# Patient Record
Sex: Female | Born: 1980 | Race: White | Hispanic: No | Marital: Single | Smoking: Current every day smoker
Health system: Southern US, Community
[De-identification: ages and names within clinical notes are randomized; demographics above are authoritative.]

## PROBLEM LIST (undated history)

## (undated) DIAGNOSIS — F32A Depression, unspecified: Secondary | ICD-10-CM

## (undated) DIAGNOSIS — G2581 Restless legs syndrome: Secondary | ICD-10-CM

## (undated) DIAGNOSIS — M2550 Pain in unspecified joint: Secondary | ICD-10-CM

## (undated) HISTORY — DX: Restless legs syndrome: G25.81

## (undated) HISTORY — DX: Depression, unspecified: F32.A

## (undated) HISTORY — PX: MOUTH SURGERY: SHX715

## (undated) HISTORY — DX: Pain in unspecified joint: M25.50

---

## 2004-01-11 ENCOUNTER — Emergency Department: Payer: Self-pay | Admitting: Emergency Medicine

## 2004-02-23 ENCOUNTER — Emergency Department: Payer: Self-pay | Admitting: Emergency Medicine

## 2005-07-22 ENCOUNTER — Emergency Department: Payer: Self-pay | Admitting: Emergency Medicine

## 2006-08-13 ENCOUNTER — Emergency Department: Payer: Self-pay | Admitting: Internal Medicine

## 2012-07-23 ENCOUNTER — Emergency Department: Payer: Self-pay | Admitting: Emergency Medicine

## 2012-07-23 LAB — BASIC METABOLIC PANEL
BUN: 6 mg/dL — ABNORMAL LOW (ref 7–18)
Calcium, Total: 8.7 mg/dL (ref 8.5–10.1)
EGFR (Non-African Amer.): >60
Glucose: 90 mg/dL (ref 65–99)
Potassium: 3.6 mmol/L (ref 3.5–5.1)

## 2012-07-23 LAB — CK TOTAL AND CKMB (NOT AT ARMC): CK-MB: <0.5 ng/mL — ABNORMAL LOW (ref 0.5–3.6)

## 2012-07-23 LAB — PROTIME-INR: Prothrombin Time: 11.8 secs (ref 11.5–14.7)

## 2012-07-23 LAB — CBC
HCT: 42.1 % (ref 35.0–47.0)
HGB: 14.9 g/dL (ref 12.0–16.0)
MCHC: 35.4 g/dL (ref 32.0–36.0)
Platelet: 245 10*3/uL (ref 150–440)
RBC: 4.59 10*6/uL (ref 3.80–5.20)
RDW: 14.2 % (ref 11.5–14.5)
WBC: 11 10*3/uL (ref 3.6–11.0)

## 2012-07-30 ENCOUNTER — Encounter: Payer: Self-pay | Admitting: Cardiovascular Disease

## 2012-07-30 ENCOUNTER — Ambulatory Visit (INDEPENDENT_AMBULATORY_CARE_PROVIDER_SITE_OTHER): Payer: Self-pay | Admitting: Cardiovascular Disease

## 2012-07-30 VITALS — BP 112/74 | HR 92 | Ht 61.0 in | Wt 168.5 lb

## 2012-07-30 DIAGNOSIS — F17200 Nicotine dependence, unspecified, uncomplicated: Secondary | ICD-10-CM | POA: Insufficient documentation

## 2012-07-30 DIAGNOSIS — R079 Chest pain, unspecified: Secondary | ICD-10-CM

## 2012-07-30 DIAGNOSIS — R0602 Shortness of breath: Secondary | ICD-10-CM | POA: Insufficient documentation

## 2012-07-30 NOTE — Patient Instructions (Addendum)
You are doing well. No medication changes were made.  Please call us if you have new issues that need to be addressed before your next appt.    

## 2012-07-30 NOTE — Assessment & Plan Note (Signed)
Lungs are clear on today's visit. We have encouraged her to stop smoking. As above, less likely PE. No tachycardia, presentation is atypical. D-dimer negative.

## 2012-07-30 NOTE — Assessment & Plan Note (Signed)
We have encouraged her to continue to work on weaning her cigarettes and smoking cessation. She will continue to work on this and does not want any assistance with chantix.  

## 2012-07-30 NOTE — Progress Notes (Signed)
Patient ID: Ellen Miller, female    DOB: [birth year], 32 y.o.   MRN: [id number]  HPI Comments: Ellen Miller is a 32 year old woman with long history of smoking since age 9 for total of 17 years who recently presented to the emergency room with left flank pain. D-dimer was negative, cardiac enzymes negative, other workup negative including normal EKG. She was discharged home with followup today.  She denies any prior cardiac history. She reports having swelling of her right ankle several weeks ago. She went on vacation over one week ago to [place]. She had walking activities a fair. She felt shortness of breath. After coming back home, she developed left-sided flank pain. With deep breath she has left-sided pain. Again workup in the emergency room was essentially normal. She feels that her breathing and pain on the left side of her chest her about the same. She's not taking deep breaths as it hurts to breathe. If she lies on her left side she has discomfort on the left flank underneath her left arm in the arm pit region. She has point tenderness in that area to palpation. She denies any heavy lifting or trauma to the area.  She has symptoms at rest and sometimes with exertion. In the emergency room she was given ibuprofen to take. EKG in the emergency room showed normal sinus rhythm with no significant ST changes, she had the same T wave abnormality in V1 through V3  EKG shows normal sinus rhythm with rate 92 beats per minute, nonspecific T wave abnormality in V1 through V3   Outpatient Encounter Prescriptions as of 07/30/2012  Medication Sig Dispense Refill  . acetaminophen (TYLENOL) 325 MG tablet Take 650 mg by mouth every 6 (six) hours as needed for pain.      Marland Kitchen gabapentin (NEURONTIN) 600 MG tablet Take 600 mg by mouth daily.      Marland Kitchen ibuprofen (ADVIL,MOTRIN) 800 MG tablet Take 800 mg by mouth as needed for pain.      . traMADol (ULTRAM) 50 MG tablet Take 50 mg by mouth every 4 (four) hours as needed  for pain.        Review of Systems  Constitutional: Negative.   HENT: Negative.   Eyes: Negative.   Respiratory: Positive for shortness of breath.   Cardiovascular: Negative.        Left flank pain underneath her left arm in the arm pit region, tender to palpation and when lying on it  Gastrointestinal: Negative.   Musculoskeletal: Negative.   Skin: Negative.   Neurological: Negative.   Psychiatric/Behavioral: Negative.   All other systems reviewed and are negative.    BP 112/74  Pulse 92  Ht 5\' 1"  (1.549 m)  Wt 168 lb 8 oz (76.431 kg)  BMI 31.85 kg/m2   Physical Exam  Nursing note and vitals reviewed. Constitutional: She is oriented to person, place, and time. She appears well-developed and well-nourished.  HENT:  Head: Normocephalic.  Nose: Nose normal.  Mouth/Throat: Oropharynx is clear and moist.  Eyes: Conjunctivae are normal. Pupils are equal, round, and reactive to light.  Neck: Normal range of motion. Neck supple. No JVD present.  Cardiovascular: Normal rate, regular rhythm, S1 normal, S2 normal, normal heart sounds and intact distal pulses.  Exam reveals no gallop and no friction rub.   No murmur heard. Pulmonary/Chest: Effort normal and breath sounds normal. No respiratory distress. She has no wheezes. She has no rales. She exhibits no tenderness.  Abdominal: Soft. Bowel sounds are  normal. She exhibits no distension. There is no tenderness.  Musculoskeletal: Normal range of motion. She exhibits no edema and no tenderness.  Lymphadenopathy:    She has no cervical adenopathy.  Neurological: She is alert and oriented to person, place, and time. Coordination normal.  Skin: Skin is warm and dry. No rash noted. No erythema.  Psychiatric: She has a normal mood and affect. Her behavior is normal. Judgment and thought content normal.    Assessment and Plan

## 2012-07-30 NOTE — Assessment & Plan Note (Addendum)
Left flank pain underneath her armpit on the left is atypical in nature, tender to palpation and when she lies on her left side. Does not appear to be cardiac in presentation. We did discuss doing a chest CT scan with contrast. We will hold off for now as presentation is atypical. D-dimer was negative making PE less likely. Unable to exclude pleuritic etiology as she has some discomfort with deep breathing. I suggested symptoms get worse that she contact our office for further evaluation. We will order a CT scan. She does not have insurance.  Less likely coronary artery disease and ischemia as symptoms occur with rest and are positional in nature.

## 2013-09-01 ENCOUNTER — Emergency Department: Payer: Self-pay | Admitting: Internal Medicine

## 2014-08-20 ENCOUNTER — Ambulatory Visit: Admission: EM | Admit: 2014-08-20 | Discharge: 2014-08-20 | Discharge status: Absent without leave | Payer: Self-pay

## 2014-08-20 ENCOUNTER — Ambulatory Visit
Admission: EM | Admit: 2014-08-20 | Discharge: 2014-08-20 | Disposition: A | Discharge status: Home / Self Care | Payer: Self-pay | Attending: Emergency Medicine | Admitting: Emergency Medicine

## 2014-08-20 ENCOUNTER — Ambulatory Visit: Payer: Self-pay | Attending: Emergency Medicine

## 2014-08-20 DIAGNOSIS — M7989 Other specified soft tissue disorders: Secondary | ICD-10-CM

## 2014-08-20 MED ORDER — DICLOFENAC SODIUM 75 MG PO TBEC: 75.0000 mg | DELAYED_RELEASE_TABLET | Freq: Two times a day (BID) | ORAL | Status: DC

## 2014-08-20 NOTE — ED Provider Notes (Addendum)
HPI  SUBJECTIVE:  Ellen Miller is a 34 y.o. female who presents with left calf pain, swelling down to her foot for the past several days. States that her right foot started swelling yesterday. She reports pain that is sharp, throbbing, worse with walking, slightly better with elevation. Pain is unaffected with dorsiflexion, plantarflexion, but only when putting weight on it. She tried ibuprofen 800 mg and Tylenol twice yesterday which she states is not helping. She also tried warm soaks. She reports slightly decreased sensation in her left foot, states it "feels asleep". She denies any numbness or tingling. There is no erythema, rash, trauma, color changes. She denies being out in the heat for prolonged periods of time.  She also reports polyarthralgias for the past several weeks in her elbows, wrists, ankles, knees, hips. She denies any tick bite, joint redness. Is concerned that this is RA. No nausea, vomiting or fevers. No orthopnea, nocturia, PND, unintentional weight gain. No exogenous estrogen, prolonged immobilization, surgery in the past 4 weeks, history of DVT, history of PE. No history of CHF, hypertension, diabetes,  cancer.     Past Medical History  Diagnosis Date  . RLS (restless legs syndrome)     Past Surgical History  Procedure Laterality Date  . Mouth surgery      Family History  Problem Relation Age of Onset  . Hypertension Mother   . Heart disease Father     stents    History  Substance Use Topics  . Smoking status: Current Every Day Smoker -- 1.50 packs/day    Types: Cigarettes  . Smokeless tobacco: Not on file  . Alcohol Use: Yes     Comment: occasional    No current facility-administered medications for this encounter.  Current outpatient prescriptions:  .  acetaminophen (TYLENOL) 325 MG tablet, Take 650 mg by mouth every 6 (six) hours as needed for pain., Disp: , Rfl:  .  diclofenac (VOLTAREN) 75 MG EC tablet, Take 1 tablet (75 mg total) by mouth 2  (two) times daily. Take with food, Disp: 30 tablet, Rfl: 0 .  gabapentin (NEURONTIN) 600 MG tablet, Take 600 mg by mouth daily., Disp: , Rfl:  .  traMADol (ULTRAM) 50 MG tablet, Take 50 mg by mouth every 4 (four) hours as needed for pain., Disp: , Rfl:   No Known Allergies   ROS  As noted in HPI.   Physical Exam  BP 119/84 mmHg  Pulse 88  Temp(Src) 98.2 F (36.8 C) (Tympanic)  Ht  (1.549 m)  Wt 168 lb (76.204 kg)  BMI 31.76 kg/m2  SpO2 100%  LMP   Constitutional: Well developed, well nourished, no acute distress Eyes:  EOMI, conjunctiva normal bilaterally HENT: Normocephalic, atraumatic,mucus membranes moist Respiratory: Normal inspiratory effort Cardiovascular: Normal rate GI: nondistended skin: No rash, skin intact Musculoskeletal:1+ pitting edema bilaterally, DP 2+ bilaterally. Sensation grossly intact. Tenderness along posterior aspect left calf, no tenderness along the right. Right calf measures 37 cm, left calf measures 37.5 cm. No joint erythema, signs of trauma, bruising along her distal lower extremities.  Neurologic: Alert & oriented x 3, no focal neuro deficits Psychiatric: Speech and behavior appropriate   ED Course   Medications - No data to display  Orders Placed This Encounter  Procedures  . US Venous Img Lower Unilateral Left    Standing Status: Future     Number of Occurrences:      Standing Expiration Date: 10/21/2015    Order Specific Question:  Reason for Exam (SYMPTOM  OR DIAGNOSIS REQUIRED)    Answer:  LE edema, calf pain R/o DVT    Order Specific Question:  Preferred imaging location?    Answer:  [facility]    Order Specific Question:  Call Report- Best Contact Number?    Answer:  [phone] [facility] call with results    No results found for this or any previous visit (from the past 24 hour(s)). No results found.  ED Clinical Impression  Leg swelling  ED Assessment/Plan  Vitals are normal. Discussed with patient that  we are unable to do a workup for rheumatoid arthritis here, advised follow-up with her primary care physician for this. Pt's primary concern today seems to be the left leg swelling. No evidence of septic joint. Concern for DVT given physical exam.   Ordered OP u/s. She has an appointment today at [facility] at 4:30. If negative, patient will go home with elevation, decrease salt, compression hose, diclofenac for the joint aches, if positive she'll go to the ER for acute DVT. Patient agrees with plan.   *This clinic note was created using Dragon dictation software. Therefore, there may be occasional mistakes despite careful proofreading.  ?    Domenick Gong, MD 08/20/14 2017  10/10 1023- pt did not show for venous u/s.   Domenick Gong, MD 11/01/15 1023

## 2014-08-20 NOTE — ED Notes (Signed)
Pt states "I have joint swelling and pain for 3 days."

## 2014-08-20 NOTE — Discharge Instructions (Signed)
Elevation, compression stockings. Diclofenac for the joint aches. You may take 1 g of Tylenol with the diclofenac. You can take up to 4 g of Tylenol per day. Follow-up with your doctor about the joint aches and possible rheumatoid arthritis workup. Stay at the imaging center until the report is called in to me. If your ultrasound is negative for blood clot in your leg, then plan as above. If your ultrasound is positive for blood clot, you will need to go to the emergency room.  Your appointment is at 4:30 at the at [facility] in the medical mall, please arrive there at 4 pm.

## 2014-08-20 NOTE — ED Notes (Signed)
"  My mon and aunt have RA. I have a PCP, [facility] but I have been scared to go find out about RA."

## 2015-02-04 ENCOUNTER — Ambulatory Visit
Admission: EM | Admit: 2015-02-04 | Discharge: 2015-02-04 | Disposition: A | Discharge status: Home / Self Care | Payer: 59 | Attending: Family Medicine | Admitting: Family Medicine

## 2015-02-04 DIAGNOSIS — J069 Acute upper respiratory infection, unspecified: Secondary | ICD-10-CM | POA: Diagnosis not present

## 2015-02-04 MED ORDER — OSELTAMIVIR PHOSPHATE 75 MG PO CAPS: 75.0000 mg | ORAL_CAPSULE | Freq: Two times a day (BID) | ORAL | Status: DC

## 2015-02-04 NOTE — ED Notes (Signed)
Started yesterday morning with nasal drainage and congestion. + cough.

## 2015-02-04 NOTE — Discharge Instructions (Signed)
Take medication as prescribed. Rest. Drink clear fluids. Take over-the-counter Tylenol or ibuprofen as needed.  Follow-up with primary care physician this week as needed. Return to urgent care proceed to ER for new or worsening concerns.  Upper Respiratory Infection, Adult Most upper respiratory infections (URIs) are a viral infection of the air passages leading to the lungs. A URI affects the nose, throat, and upper air passages. The most common type of URI is nasopharyngitis and is typically referred to as "the common cold." URIs run their course and usually go away on their own. Most of the time, a URI does not require medical attention, but sometimes a bacterial infection in the upper airways can follow a viral infection. This is called a secondary infection. Sinus and middle ear infections are common types of secondary upper respiratory infections. Bacterial pneumonia can also complicate a URI. A URI can worsen asthma and chronic obstructive pulmonary disease (COPD). Sometimes, these complications can require emergency medical care and may be life threatening.  CAUSES Almost all URIs are caused by viruses. A virus is a type of germ and can spread from one person to another.  RISKS FACTORS You may be at risk for a URI if:   You smoke.   You have chronic heart or lung disease.  You have a weakened defense (immune) system.   You are very young or very old.   You have nasal allergies or asthma.  You work in crowded or poorly ventilated areas.  You work in health care facilities or schools. SIGNS AND SYMPTOMS  Symptoms typically develop 2-3 days after you come in contact with a cold virus. Most viral URIs last 7-10 days. However, viral URIs from the influenza virus (flu virus) can last 14-18 days and are typically more severe. Symptoms may include:   Runny or stuffy (congested) nose.   Sneezing.   Cough.   Sore throat.   Headache.   Fatigue.   Fever.   Loss of  appetite.   Pain in your forehead, behind your eyes, and over your cheekbones (sinus pain).  Muscle aches.  DIAGNOSIS  Your health care provider may diagnose a URI by:  Physical exam.  Tests to check that your symptoms are not due to another condition such as:  Strep throat.  Sinusitis.  Pneumonia.  Asthma. TREATMENT  A URI goes away on its own with time. It cannot be cured with medicines, but medicines may be prescribed or recommended to relieve symptoms. Medicines may help:  Reduce your fever.  Reduce your cough.  Relieve nasal congestion. HOME CARE INSTRUCTIONS   Take medicines only as directed by your health care provider.   Gargle warm saltwater or take cough drops to comfort your throat as directed by your health care provider.  Use a warm mist humidifier or inhale steam from a shower to increase air moisture. This may make it easier to breathe.  Drink enough fluid to keep your urine clear or pale yellow.   Eat soups and other clear broths and maintain good nutrition.   Rest as needed.   Return to work when your temperature has returned to normal or as your health care provider advises. You may need to stay home longer to avoid infecting others. You can also use a face mask and careful hand washing to prevent spread of the virus.  Increase the usage of your inhaler if you have asthma.   Do not use any tobacco products, including cigarettes, chewing tobacco, or electronic cigarettes.  If you need help quitting, ask your health care provider. PREVENTION  The best way to protect yourself from getting a cold is to practice good hygiene.   Avoid oral or hand contact with people with cold symptoms.   Wash your hands often if contact occurs.  There is no clear evidence that vitamin C, vitamin E, echinacea, or exercise reduces the chance of developing a cold. However, it is always recommended to get plenty of rest, exercise, and practice good nutrition.    SEEK MEDICAL CARE IF:   You are getting worse rather than better.   Your symptoms are not controlled by medicine.   You have chills.  You have worsening shortness of breath.  You have brown or red mucus.  You have yellow or brown nasal discharge.  You have pain in your face, especially when you bend forward.  You have a fever.  You have swollen neck glands.  You have pain while swallowing.  You have white areas in the back of your throat. SEEK IMMEDIATE MEDICAL CARE IF:   You have severe or persistent:  Headache.  Ear pain.  Sinus pain.  Chest pain.  You have chronic lung disease and any of the following:  Wheezing.  Prolonged cough.  Coughing up blood.  A change in your usual mucus.  You have a stiff neck.  You have changes in your:  Vision.  Hearing.  Thinking.  Mood. MAKE SURE YOU:   Understand these instructions.  Will watch your condition.  Will get help right away if you are not doing well or get worse.   This information is not intended to replace advice given to you by your health care provider. Make sure you discuss any questions you have with your health care provider.   Document Released: 07/04/2000 Document Revised: 05/25/2014 Document Reviewed: 04/15/2013 Elsevier Interactive Patient Education [employer].

## 2015-02-04 NOTE — ED Provider Notes (Signed)
[facility]  ____________________________________________  Time seen: Approximately 6:12 PM  I have reviewed the triage vital signs and the nursing notes.   HISTORY  Chief Complaint URI   HPI Ellen Miller is a 35 y.o. female presents with spouse at bedside for the complaints of 1 day history of runny nose, nasal congestion, body aches, cough, sinus congestion with intermittent fever. Reports she believes her fever was 99 degrees orally. Reports symptoms have been unresolved with over-the-counter cough and congestion medication. Reports general body aches at this time at 4 out of 10. 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 Patient also reports that this past Sunday she was around a friend has since tested positive for influenza.  Denies chest pain, shortness of breath, abdominal pain, vomiting, diarrhea, dizziness, rash, neck or back pain. Denies dysuria. Denies vaginal complaints.  Last menstrual. One week ago. Denies chance of pregnancy.    Past Medical History  Diagnosis Date  . RLS (restless legs syndrome)     Patient Active Problem List   Diagnosis Date Noted  . Chest pain 07/30/2012  . Shortness of breath 07/30/2012  . Smoker 07/30/2012    Past Surgical History  Procedure Laterality Date  . Mouth surgery      Current Outpatient Rx  Name  Route  Sig  Dispense  Refill  . acetaminophen (TYLENOL) 325 MG tablet   Oral   Take 650 mg by mouth every 6 (six) hours as needed for pain.         Marland Kitchen diclofenac (VOLTAREN) 75 MG EC tablet   Oral   Take 1 tablet (75 mg total) by mouth 2 (two) times daily. Take with food   30 tablet   0   . gabapentin (NEURONTIN) 600 MG tablet   Oral   Take 600 mg by mouth daily.         . traMADol (ULTRAM) 50 MG tablet   Oral   Take 50 mg by mouth every 4 (four) hours as needed for pain.           Allergies Review of patient's allergies indicates no known  allergies.  Family History  Problem Relation Age of Onset  . Hypertension Mother   . Heart disease Father     stents    Social History Social History  Substance Use Topics  . Smoking status: Current Every Day Smoker -- 1.00 packs/day    Types: Cigarettes  . Smokeless tobacco: None  . Alcohol Use: Yes     Comment: occasional    Review of Systems Constitutional: Positive report of fevers. Eyes: No visual changes. ENT: No sore throat. Positive runny nose, nasal congestion, sinus congestion and pressure. Positive intermittent cough. Cardiovascular: Denies chest pain. Respiratory: Denies shortness of breath. Gastrointestinal: No abdominal pain.  No nausea, no vomiting.  No diarrhea.  No constipation. Genitourinary: Negative for dysuria. Musculoskeletal: Negative for back pain. Skin: Negative for rash. Neurological: Negative for headaches, focal weakness or numbness.  10-point ROS otherwise negative.  ____________________________________________   PHYSICAL EXAM:  VITAL SIGNS: ED Triage Vitals  Enc Vitals Group     BP 02/04/15 1700 128/83 mmHg     Pulse Rate 02/04/15 1700 93     Resp 02/04/15 1700 16     Temp 02/04/15 1700 97.9 F (36.6 C)     Temp Source 02/04/15 1700 Tympanic     SpO2 02/04/15 1700 99 %     Weight 02/04/15  1700 165 lb (74.844 kg)     Height 02/04/15 1700 5\' 1"  (1.549 m)     Head Cir --      Peak Flow --      Pain Score 02/04/15 1704 7     Pain Loc --      Pain Edu? --      Excl. in GC? --     Constitutional: Alert and oriented. Well appearing and in no acute distress. Eyes: Conjunctivae are normal. PERRL. EOMI. Head: Atraumatic. Mild tenderness to palpation on frontal and maxillary sinuses. No swelling. No erythema.  Ears: no erythema, normal TMs bilaterally.   Nose: Positive nasal congestion and clear rhinorrhea.  Mouth/Throat: Mucous membranes are moist.  Oropharynx non-erythematous. No tonsillar swelling or exudate. Neck: No stridor.  No  cervical spine tenderness to palpation. Hematological/Lymphatic/Immunilogical: No cervical lymphadenopathy. Cardiovascular: Normal rate, regular rhythm. Grossly normal heart sounds.  Good peripheral circulation. Respiratory: Normal respiratory effort.  No retractions. Lungs CTAB. No wheezes, rales or rhonchi. Good air movement. Gastrointestinal: Soft and nontender. No CVA tenderness. Musculoskeletal: No lower or upper extremity tenderness nor edema.  Bilateral pedal pulses equal and easily palpated.  Neurologic:  Normal speech and language. No gross focal neurologic deficits are appreciated. No gait instability. Skin:  Skin is warm, dry and intact. No rash noted. Psychiatric: Mood and affect are normal. Speech and behavior are normal.  ____________________________________________   LABS (all labs ordered are listed, but only abnormal results are displayed)  Labs Reviewed - No data to display   INITIAL IMPRESSION / ASSESSMENT AND PLAN / ED COURSE  Pertinent labs & imaging results that were available during my care of the patient were reviewed by me and considered in my medical decision making (see chart for details).  Very well-appearing patient. No acute distress. Presents for the complaint of one day history of runny nose, nasal congestion, body aches and report of fever. Reports continues to eat and drink well. Lungs clear throughout. Abdomen soft and nontender. Moist mucous membranes. Patient also reports positive exposure to a friend that was positive for influenza. Suspect influenza. Discussed with patient evaluation by influenza swab versus treat. Patient reports she prefers to go ahead and be treated in absence of swabbing. Will treat patient with oral Tamiflu. Encouraged patient to rest, fluids including over-the-counter use of cough and congestion medicines or Claritin-D as needed. Work note given for today and tomorrow.    Discussed follow up with Primary care physician this week.  Discussed follow up and return parameters including no resolution or any worsening concerns. Patient verbalized understanding and agreed to plan.   ____________________________________________   FINAL CLINICAL IMPRESSION(S) / ED DIAGNOSES  Final diagnoses:  Upper respiratory infection       Renford DillsLindsey [place], NP 02/04/15 1825

## 2015-11-01 NOTE — Progress Notes (Signed)
Pt did not show for appt

## 2019-09-17 ENCOUNTER — Inpatient Hospital Stay: Payer: Managed Care, Other (non HMO) | Attending: Oncology | Admitting: Oncology

## 2019-09-17 ENCOUNTER — Encounter: Payer: Self-pay | Admitting: Oncology

## 2019-09-17 ENCOUNTER — Inpatient Hospital Stay: Payer: Managed Care, Other (non HMO)

## 2019-09-17 ENCOUNTER — Other Ambulatory Visit: Payer: Self-pay

## 2019-09-17 VITALS — BP 127/87 | HR 90 | Temp 98.3°F | Resp 18 | Ht 62.0 in | Wt 162.2 lb

## 2019-09-17 DIAGNOSIS — Z635 Disruption of family by separation and divorce: Secondary | ICD-10-CM | POA: Insufficient documentation

## 2019-09-17 DIAGNOSIS — M25551 Pain in right hip: Secondary | ICD-10-CM | POA: Diagnosis not present

## 2019-09-17 DIAGNOSIS — Z808 Family history of malignant neoplasm of other organs or systems: Secondary | ICD-10-CM | POA: Diagnosis not present

## 2019-09-17 DIAGNOSIS — D72829 Elevated white blood cell count, unspecified: Secondary | ICD-10-CM | POA: Insufficient documentation

## 2019-09-17 DIAGNOSIS — Z803 Family history of malignant neoplasm of breast: Secondary | ICD-10-CM | POA: Insufficient documentation

## 2019-09-17 DIAGNOSIS — R630 Anorexia: Secondary | ICD-10-CM | POA: Insufficient documentation

## 2019-09-17 DIAGNOSIS — D751 Secondary polycythemia: Secondary | ICD-10-CM | POA: Insufficient documentation

## 2019-09-17 DIAGNOSIS — D72828 Other elevated white blood cell count: Secondary | ICD-10-CM

## 2019-09-17 DIAGNOSIS — M25552 Pain in left hip: Secondary | ICD-10-CM | POA: Insufficient documentation

## 2019-09-17 DIAGNOSIS — F17210 Nicotine dependence, cigarettes, uncomplicated: Secondary | ICD-10-CM | POA: Diagnosis not present

## 2019-09-17 LAB — COMPREHENSIVE METABOLIC PANEL
ALT: 13 U/L (ref 0–44)
AST: 13 U/L — ABNORMAL LOW (ref 15–41)
Albumin: 3.9 g/dL (ref 3.5–5.0)
Alkaline Phosphatase: 82 U/L (ref 38–126)
Anion gap: 8 (ref 5–15)
BUN: 10 mg/dL (ref 6–20)
CO2: 26 mmol/L (ref 22–32)
Calcium: 8.7 mg/dL — ABNORMAL LOW (ref 8.9–10.3)
Chloride: 103 mmol/L (ref 98–111)
Creatinine, Ser: 0.59 mg/dL (ref 0.44–1.00)
GFR calc Af Amer: >60 mL/min (ref >60)
GFR calc non Af Amer: >60 mL/min (ref >60)
Glucose, Bld: 95 mg/dL (ref 70–99)
Potassium: 3.6 mmol/L (ref 3.5–5.1)
Sodium: 137 mmol/L (ref 135–145)
Total Bilirubin: 0.4 mg/dL (ref 0.3–1.2)
Total Protein: 7.5 g/dL (ref 6.5–8.1)

## 2019-09-17 LAB — CBC WITH DIFFERENTIAL/PLATELET
Abs Immature Granulocytes: 0.04 10*3/uL (ref 0.00–0.07)
Basophils Absolute: 0.1 10*3/uL (ref 0.0–0.1)
Basophils Relative: 0 %
Eosinophils Absolute: 0.2 10*3/uL (ref 0.0–0.5)
Eosinophils Relative: 2 %
HCT: 43.9 % (ref 36.0–46.0)
Hemoglobin: 15.4 g/dL — ABNORMAL HIGH (ref 12.0–15.0)
Immature Granulocytes: 0 %
Lymphocytes Relative: 20 %
Lymphs Abs: 3 10*3/uL (ref 0.7–4.0)
MCH: 33.5 pg (ref 26.0–34.0)
MCHC: 35.1 g/dL (ref 30.0–36.0)
MCV: 95.4 fL (ref 80.0–100.0)
Monocytes Absolute: 0.8 10*3/uL (ref 0.1–1.0)
Monocytes Relative: 5 %
Neutro Abs: 10.9 10*3/uL — ABNORMAL HIGH (ref 1.7–7.7)
Neutrophils Relative %: 73 %
Platelets: 326 10*3/uL (ref 150–400)
RBC: 4.6 MIL/uL (ref 3.87–5.11)
RDW: 13.8 % (ref 11.5–15.5)
WBC: 15.1 10*3/uL — ABNORMAL HIGH (ref 4.0–10.5)
nRBC: 0 % (ref 0.0–0.2)

## 2019-09-17 LAB — TSH: TSH: 0.729 u[IU]/mL (ref 0.350–4.500)

## 2019-09-17 LAB — TECHNOLOGIST SMEAR REVIEW: Plt Morphology: NORMAL

## 2019-09-17 NOTE — Progress Notes (Signed)
Hematology/Oncology Consult note [facility] [phone] [phone]   Patient Care Team: Jacquenette Shone, NP as PCP - General (Family Medicine)  REFERRING PROVIDER: Jacquenette Shone, NP  CHIEF COMPLAINTS/REASON FOR VISIT:  Evaluation of leukocytosis and elevation of hemoglobin  HISTORY OF PRESENTING ILLNESS:  Ellen Miller is a  39 y.o.  female with PMH listed below who was referred to me for evaluation of leukocytosis and elevated hemoglobin Reviewed patient' recent labs obtained by PCP.   CBC showed elevated white count of 13.8, RBC 4.56, MCHC 34.5, platelet count 341,000. Per patient, she has had chronically elevated white count for about a year. Patient reports decreased appetite, she attributes to going through a lot of stress in life.  She is currently going through divorce. She now weighs 162 pounds.  She reports to be 180 pounds last year. She has bilateral hip pain and hand joint swelling.  She has had rheumatology work-up and was told that she does not have any rheumatology condition.   She has had negative hepatitis panel, HIV, no M protein on protein electrophoresis. She was seen by orthopedic surgeon and has had joint injection which did not help but actually worsen her pain. More recently she has had swelling of the right wrist and was seen by Dr. Lorenza Chick and was prescribed a course of 6-day tapering course of prednisone.  Review of Systems  Constitutional: Positive for appetite change. Negative for chills, fatigue and fever.  HENT:   Negative for hearing loss and voice change.   Eyes: Negative for eye problems.  Respiratory: Negative for chest tightness and cough.   Cardiovascular: Negative for chest pain.  Gastrointestinal: Negative for abdominal distention, abdominal pain and blood in stool.  Endocrine: Negative for hot flashes.  Genitourinary: Negative for difficulty urinating and frequency.   Musculoskeletal: Positive  for arthralgias.       Joint swelling  Skin: Negative for itching and rash.  Neurological: Negative for extremity weakness.  Hematological: Negative for adenopathy.  Psychiatric/Behavioral: Negative for confusion.     MEDICAL HISTORY:  Past Medical History:  Diagnosis Date  . Depression   . Joint pain   . RLS (restless legs syndrome)     SURGICAL HISTORY: Past Surgical History:  Procedure Laterality Date  . MOUTH SURGERY      SOCIAL HISTORY: Social History   Socioeconomic History  . Marital status: Single    Spouse name: Not on file  . Number of children: Not on file  . Years of education: Not on file  . Highest education level: Not on file  Occupational History  . Occupation: [employer]   Tobacco Use  . Smoking status: Current Every Day Smoker    Packs/day: 1.00    Years: 25.00    Pack years: 25.00    Types: Cigarettes  . Smokeless tobacco: Never Used  Vaping Use  . Vaping Use: Never used  Substance and Sexual Activity  . Alcohol use: Not Currently    Comment: occasional  . Drug use: No  . Sexual activity: Not on file  Other Topics Concern  . Not on file  Social History Narrative  . Not on file   Social Determinants of Health   Financial Resource Strain:   . Difficulty of Paying Living Expenses: Not on file  Food Insecurity:   . Worried About Programme researcher, broadcasting/film/video in the Last Year: Not on file  . Ran Out of Food in the Last Year: Not on file  Transportation Needs:   . Freight forwarderLack of Transportation (Medical): Not on file  . Lack of Transportation (Non-Medical): Not on file  Physical Activity:   . Days of Exercise per Week: Not on file  . Minutes of Exercise per Session: Not on file  Stress:   . Feeling of Stress : Not on file  Social Connections:   . Frequency of Communication with Friends and Family: Not on file  . Frequency of Social Gatherings with Friends and Family: Not on file  . Attends Religious Services: Not on file  . Active Member of Clubs or  Organizations: Not on file  . Attends BankerClub or Organization Meetings: Not on file  . Marital Status: Not on file  Intimate Partner Violence:   . Fear of Current or Ex-Partner: Not on file  . Emotionally Abused: Not on file  . Physically Abused: Not on file  . Sexually Abused: Not on file    FAMILY HISTORY: Family History  Problem Relation Age of Onset  . Hypertension Mother   . COPD Mother   . Heart disease Father        stents  . Heart defect Sister   . Throat cancer Maternal Uncle   . Breast cancer Paternal Aunt     ALLERGIES:  has No Known Allergies.  MEDICATIONS:  Current Outpatient Medications  Medication Sig Dispense Refill  . acetaminophen (TYLENOL) 325 MG tablet Take 650 mg by mouth every 6 (six) hours as needed for pain.    . D2000 ULTRA STRENGTH 50 MCG (2000 UT) CAPS Take 1 capsule by mouth daily.    Marland Kitchen. gabapentin (NEURONTIN) 600 MG tablet Take 600 mg by mouth daily.    . hydrochlorothiazide (MICROZIDE) 12.5 MG capsule Take 12.5 mg by mouth daily.    Marland Kitchen. ibuprofen (ADVIL) 800 MG tablet Take 800 mg by mouth 3 (three) times daily.    . ondansetron (ZOFRAN-ODT) 4 MG disintegrating tablet Take 4 mg by mouth every 8 (eight) hours as needed.    . SUMAtriptan (IMITREX) 50 MG tablet Take by mouth. Take 50 mg by mouth as directed May take a second dose after 2 hours if needed    . zolpidem (AMBIEN) 10 MG tablet Take 5 mg by mouth at bedtime as needed.    . traMADol (ULTRAM) 50 MG tablet Take 50 mg by mouth every 4 (four) hours as needed for pain. (Patient not taking: Reported on 09/17/2019)     No current facility-administered medications for this visit.     PHYSICAL EXAMINATION: ECOG PERFORMANCE STATUS: 1 - Symptomatic but completely ambulatory Vitals:   09/17/19 1004  BP: 127/87  Pulse: 90  Resp: 18  Temp: 98.3 F (36.8 C)   Filed Weights   09/17/19 1004  Weight: 162 lb 3.2 oz (73.6 kg)    Physical Exam Constitutional:      General: She is not in acute  distress. HENT:     Head: Normocephalic and atraumatic.  Eyes:     General: No scleral icterus. Cardiovascular:     Rate and Rhythm: Normal rate and regular rhythm.     Heart sounds: Normal heart sounds.  Pulmonary:     Effort: Pulmonary effort is normal. No respiratory distress.     Breath sounds: No wheezing.  Abdominal:     General: Bowel sounds are normal. There is no distension.     Palpations: Abdomen is soft.  Musculoskeletal:        General: No deformity. Normal range of motion.  Cervical back: Normal range of motion and neck supple.  Skin:    General: Skin is warm and dry.     Findings: No erythema or rash.  Neurological:     Mental Status: She is alert and oriented to person, place, and time. Mental status is at baseline.     Cranial Nerves: No cranial nerve deficit.     Coordination: Coordination normal.  Psychiatric:        Mood and Affect: Mood normal.     CMP Latest Ref Rng & Units 07/23/2012  Glucose 65 - 99 mg/dL 90  BUN 7 - 18 mg/dL 6(L)  Creatinine 7.74 - 1.30 mg/dL 1.28  Sodium 786 - 767 mmol/L 140  Potassium 3.5 - 5.1 mmol/L 3.6  Chloride 98 - 107 mmol/L 108(H)  CO2 21 - 32 mmol/L 27  Calcium 8.5 - 10.1 mg/dL 8.7   CBC Latest Ref Rng & Units 07/23/2012  WBC 3.6 - 11.0 x10 3/mm 3 11.0  Hemoglobin 12.0 - 16.0 g/dL 20.9  Hematocrit 47.0 - 47.0 % 42.1  Platelets 150 - 440 x10 3/mm 3 245     RADIOGRAPHIC STUDIES: I have personally reviewed the radiological images as listed and agreed with the findings in the report. No results found.  LABORATORY DATA:  I have reviewed the data as listed Lab Results  Component Value Date   WBC 11.0 07/23/2012   HGB 14.9 07/23/2012   HCT 42.1 07/23/2012   MCV 92 07/23/2012   PLT 245 07/23/2012   No results for input(s): NA, K, CL, CO2, GLUCOSE, BUN, CREATININE, CALCIUM, GFRNONAA, GFRAA, PROT, ALBUMIN, AST, ALT, ALKPHOS, BILITOT, BILIDIR, IBILI in the last 8760 hours. Iron/TIBC/Ferritin/ %Sat No results found  for: IRON, TIBC, FERRITIN, IRONPCTSAT      ASSESSMENT & PLAN:  1. Other elevated white blood cell (WBC) count   2. Erythrocytosis    Labs reviewed and discussed with patient that Leukocytosis, predominantly neutrophilia, can be secondary to infection, chronic inflammation, smoking, autoimmune disease, or underlying bone marrow disorders With patient's extensive smoking history, leukocytosis most likely is due to smoking.  Will rule out other etiologies. For the work up of patient's leukocytosis, I recommend checking CBC;CMP,   smear review, flowcytometry, etc.  Erythrocytosis, check carbon monoxide level, erythropoietin level, JAK2 with reflex.  BCR ABL.  Patient will follow up in 2 weeks to discuss results. Orders Placed This Encounter  Procedures  . CBC with Differential/Platelet    Standing Status:   Future    Number of Occurrences:   1    Standing Expiration Date:   09/16/2020  . Comprehensive metabolic panel    Standing Status:   Future    Number of Occurrences:   1    Standing Expiration Date:   09/16/2020  . Technologist smear review    Standing Status:   Future    Number of Occurrences:   1    Standing Expiration Date:   09/16/2020  . Flow cytometry panel-leukemia/lymphoma work-up    Standing Status:   Future    Number of Occurrences:   1    Standing Expiration Date:   09/16/2020  . JAK2 V617F, w Reflex to CALR/E12/MPL    Standing Status:   Future    Number of Occurrences:   1    Standing Expiration Date:   09/16/2020  . BCR-ABL1 FISH    Standing Status:   Future    Number of Occurrences:   1    Standing Expiration Date:  09/16/2020  . Carbon Monoxide, Blood    Standing Status:   Future    Number of Occurrences:   1    Standing Expiration Date:   09/16/2020  . Erythropoietin    Standing Status:   Future    Number of Occurrences:   1    Standing Expiration Date:   09/16/2020  . TSH    Standing Status:   Future    Number of Occurrences:   1    Standing Expiration  Date:   09/16/2020    All questions were answered. The patient knows to call the clinic with any problems questions or concerns.  Return of visit: 2 weeks to discuss labs. Thank you for this kind referral and the opportunity to participate in the care of this patient. A copy of today's note is routed to referring provider   Rickard Patience, MD, PhD Hematology Oncology [facility] Pager- [phone] 09/17/2019

## 2019-09-17 NOTE — Progress Notes (Signed)
Pt here to establish care for elevated white blood cell count.

## 2019-09-18 LAB — ERYTHROPOIETIN: Erythropoietin: 6.7 m[IU]/mL (ref 2.6–18.5)

## 2019-09-18 LAB — CARBON MONOXIDE, BLOOD (PERFORMED AT REF LAB): Carbon Monoxide, Blood: 11.1 % — ABNORMAL HIGH (ref 0.0–3.6)

## 2019-09-21 LAB — COMP PANEL: LEUKEMIA/LYMPHOMA

## 2019-09-23 LAB — BCR-ABL1 FISH
Cells Analyzed: 200
Cells Counted: 200

## 2019-09-25 LAB — JAK2 V617F, W REFLEX TO CALR/E12/MPL

## 2019-09-25 LAB — CALR + JAK2 E12-15 + MPL (REFLEXED)

## 2019-10-02 ENCOUNTER — Other Ambulatory Visit: Payer: Self-pay

## 2019-10-02 ENCOUNTER — Encounter: Payer: Self-pay | Admitting: Oncology

## 2019-10-02 ENCOUNTER — Inpatient Hospital Stay: Payer: Managed Care, Other (non HMO) | Attending: Oncology | Admitting: Oncology

## 2019-10-02 DIAGNOSIS — D72829 Elevated white blood cell count, unspecified: Secondary | ICD-10-CM | POA: Insufficient documentation

## 2019-10-02 DIAGNOSIS — D751 Secondary polycythemia: Secondary | ICD-10-CM

## 2019-10-02 NOTE — Progress Notes (Signed)
Patient contacted for Mychart visit. No new concerns voiced.  

## 2019-10-03 NOTE — Progress Notes (Signed)
HEMATOLOGY-ONCOLOGY TeleHEALTH VISIT PROGRESS NOTE  I connected with Ellen Miller on 10/03/19 at  2:30 PM EDT by video enabled telemedicine visit and verified that I am speaking with the correct person using two identifiers. I discussed the limitations, risks, security and privacy concerns of performing an evaluation and management service by telemedicine and the availability of in-person appointments. I also discussed with the patient that there may be a patient responsible charge related to this service. The patient expressed understanding and agreed to proceed.   Other persons participating in the visit and their role in the encounter:  None  Patient's location: Home  Provider's location: office Chief Complaint: leukocytosis.    INTERVAL HISTORY Ellen Miller is a 39 y.o. female who has above history reviewed by me today presents for follow up for leukocytosis Problems and complaints are listed below:  She had work up done since last visit. No new complaints.   Review of Systems  Constitutional: Negative for appetite change, chills, fatigue and fever.  HENT:   Negative for hearing loss and voice change.   Eyes: Negative for eye problems.  Respiratory: Negative for chest tightness and cough.   Cardiovascular: Negative for chest pain.  Gastrointestinal: Negative for abdominal distention, abdominal pain and blood in stool.  Endocrine: Negative for hot flashes.  Genitourinary: Negative for difficulty urinating and frequency.   Musculoskeletal: Negative for arthralgias.  Skin: Negative for itching and rash.  Neurological: Negative for extremity weakness.  Hematological: Negative for adenopathy.  Psychiatric/Behavioral: Negative for confusion.    Past Medical History:  Diagnosis Date  . Depression   . Joint pain   . RLS (restless legs syndrome)    Past Surgical History:  Procedure Laterality Date  . MOUTH SURGERY      Family History  Problem Relation Age of Onset  .  Hypertension Mother   . COPD Mother   . Heart disease Father        stents  . Heart defect Sister   . Throat cancer Maternal Uncle   . Breast cancer Paternal Aunt     Social History   Socioeconomic History  . Marital status: Single    Spouse name: Not on file  . Number of children: Not on file  . Years of education: Not on file  . Highest education level: Not on file  Occupational History  . Occupation: [employer]   Tobacco Use  . Smoking status: Current Every Day Smoker    Packs/day: 1.00    Years: 25.00    Pack years: 25.00    Types: Cigarettes  . Smokeless tobacco: Never Used  Vaping Use  . Vaping Use: Never used  Substance and Sexual Activity  . Alcohol use: Not Currently    Comment: occasional  . Drug use: No  . Sexual activity: Not on file  Other Topics Concern  . Not on file  Social History Narrative  . Not on file   Social Determinants of Health   Financial Resource Strain:   . Difficulty of Paying Living Expenses: Not on file  Food Insecurity:   . Worried About Programme researcher, broadcasting/film/video in the Last Year: Not on file  . Ran Out of Food in the Last Year: Not on file  Transportation Needs:   . Lack of Transportation (Medical): Not on file  . Lack of Transportation (Non-Medical): Not on file  Physical Activity:   . Days of Exercise per Week: Not on file  . Minutes of Exercise per  Session: Not on file  Stress:   . Feeling of Stress : Not on file  Social Connections:   . Frequency of Communication with Friends and Family: Not on file  . Frequency of Social Gatherings with Friends and Family: Not on file  . Attends Religious Services: Not on file  . Active Member of Clubs or Organizations: Not on file  . Attends Banker Meetings: Not on file  . Marital Status: Not on file  Intimate Partner Violence:   . Fear of Current or Ex-Partner: Not on file  . Emotionally Abused: Not on file  . Physically Abused: Not on file  . Sexually Abused: Not on file     Current Outpatient Medications on File Prior to Visit  Medication Sig Dispense Refill  . acetaminophen (TYLENOL) 325 MG tablet Take 650 mg by mouth every 6 (six) hours as needed for pain.    . D2000 ULTRA STRENGTH 50 MCG (2000 UT) CAPS Take 1 capsule by mouth daily.    Marland Kitchen gabapentin (NEURONTIN) 600 MG tablet Take 600 mg by mouth daily.    . hydrochlorothiazide (MICROZIDE) 12.5 MG capsule Take 12.5 mg by mouth daily.    Marland Kitchen ibuprofen (ADVIL) 800 MG tablet Take 800 mg by mouth 3 (three) times daily.    . ondansetron (ZOFRAN-ODT) 4 MG disintegrating tablet Take 4 mg by mouth every 8 (eight) hours as needed.    . SUMAtriptan (IMITREX) 50 MG tablet Take by mouth. Take 50 mg by mouth as directed May take a second dose after 2 hours if needed    . zolpidem (AMBIEN) 10 MG tablet Take 5 mg by mouth at bedtime as needed.    . traMADol (ULTRAM) 50 MG tablet Take 50 mg by mouth every 4 (four) hours as needed for pain. (Patient not taking: Reported on 10/02/2019)     No current facility-administered medications on file prior to visit.    No Known Allergies     Observations/Objective: Today's Vitals   10/02/19 1401  PainSc: 0-No pain   There is no height or weight on file to calculate BMI.  Physical Exam Neurological:     Mental Status: She is alert.     CBC    Component Value Date/Time   WBC 15.1 (H) 09/17/2019 1043   RBC 4.60 09/17/2019 1043   HGB 15.4 (H) 09/17/2019 1043   HGB 14.9 07/23/2012 1239   HCT 43.9 09/17/2019 1043   HCT 42.1 07/23/2012 1239   PLT 326 09/17/2019 1043   PLT 245 07/23/2012 1239   MCV 95.4 09/17/2019 1043   MCV 92 07/23/2012 1239   MCH 33.5 09/17/2019 1043   MCHC 35.1 09/17/2019 1043   RDW 13.8 09/17/2019 1043   RDW 14.2 07/23/2012 1239   LYMPHSABS 3.0 09/17/2019 1043   MONOABS 0.8 09/17/2019 1043   EOSABS 0.2 09/17/2019 1043   BASOSABS 0.1 09/17/2019 1043    CMP     Component Value Date/Time   NA 137 09/17/2019 1043   NA 140 07/23/2012 1239   K 3.6  09/17/2019 1043   K 3.6 07/23/2012 1239   CL 103 09/17/2019 1043   CL 108 (H) 07/23/2012 1239   CO2 26 09/17/2019 1043   CO2 27 07/23/2012 1239   GLUCOSE 95 09/17/2019 1043   GLUCOSE 90 07/23/2012 1239   BUN 10 09/17/2019 1043   BUN 6 (L) 07/23/2012 1239   CREATININE 0.59 09/17/2019 1043   CREATININE 0.60 07/23/2012 1239   CALCIUM 8.7 (L)  09/17/2019 1043   CALCIUM 8.7 07/23/2012 1239   PROT 7.5 09/17/2019 1043   ALBUMIN 3.9 09/17/2019 1043   AST 13 (L) 09/17/2019 1043   ALT 13 09/17/2019 1043   ALKPHOS 82 09/17/2019 1043   BILITOT 0.4 09/17/2019 1043   GFRNONAA >60 09/17/2019 1043   GFRNONAA >60 07/23/2012 1239   GFRAA >60 09/17/2019 1043   GFRAA >60 07/23/2012 1239     Assessment and Plan: 1. Leukocytosis, unspecified type   2. Erythrocytosis     Labs are reviewed and discussed with patient. JAK2 V617F mutation negative, with reflex to other mutations CALR, MPL, JAK 2 Ex 12-15 mutations negative. Negative BCL-ABL FISH Negative flowcytometry.  Elevated carbon Monoxide level Discussed with patient that leukocytosis and erythrocytosis are due to smoking.  Recommend smoke cessation.   Secondary erythrocytosis, no need for phlebotomy yet.  Continue monitor.  Phlebotomy if Hct >50  Follow Up Instructions:  6 months.   I discussed the assessment and treatment plan with the patient. The patient was provided an opportunity to ask questions and all were answered. The patient agreed with the plan and demonstrated an understanding of the instructions.  The patient was advised to call back or seek an in-person evaluation if the symptoms worsen or if the condition fails to improve as anticipated.    Rickard Patience, MD 10/03/2019 11:18 AM

## 2020-03-31 ENCOUNTER — Other Ambulatory Visit: Payer: Managed Care, Other (non HMO)

## 2020-04-01 ENCOUNTER — Telehealth: Payer: Managed Care, Other (non HMO) | Admitting: Oncology

## 2020-04-01 ENCOUNTER — Inpatient Hospital Stay: Payer: Managed Care, Other (non HMO) | Attending: Oncology

## 2020-04-06 ENCOUNTER — Encounter: Payer: Self-pay | Admitting: Oncology

## 2020-04-06 ENCOUNTER — Telehealth: Payer: Self-pay

## 2020-04-06 ENCOUNTER — Inpatient Hospital Stay: Payer: Managed Care, Other (non HMO) | Admitting: Oncology

## 2020-04-06 NOTE — Telephone Encounter (Signed)
Done

## 2020-04-06 NOTE — Telephone Encounter (Signed)
Contacted pt for Mychart visit. Pt reports she was not able to get follow up labs done due to car trouble. She will not be able to do Mychart today. Offered to r/s appts but she stated that she would call back once her car issues resolved.

## 2020-04-07 NOTE — Progress Notes (Signed)
Visit was cancelled.

## 2020-04-11 ENCOUNTER — Other Ambulatory Visit: Payer: Self-pay | Admitting: Neurology

## 2020-04-11 DIAGNOSIS — R2990 Unspecified symptoms and signs involving the nervous system: Secondary | ICD-10-CM

## 2020-04-15 ENCOUNTER — Ambulatory Visit
Admission: RE | Admit: 2020-04-15 | Discharge: 2020-04-15 | Disposition: A | Discharge status: Home / Self Care | Payer: Managed Care, Other (non HMO) | Source: Ambulatory Visit | Attending: Neurology | Admitting: Neurology

## 2020-04-15 ENCOUNTER — Encounter: Payer: Self-pay | Admitting: Oncology

## 2020-04-15 ENCOUNTER — Other Ambulatory Visit: Payer: Self-pay

## 2020-04-15 DIAGNOSIS — R2990 Unspecified symptoms and signs involving the nervous system: Secondary | ICD-10-CM | POA: Insufficient documentation

## 2020-04-15 MED ORDER — GADOBUTROL 1 MMOL/ML IV SOLN
7.0000 mL | Freq: Once | INTRAVENOUS | Status: AC | PRN
  Administered 2020-04-15: 7 mL via INTRAVENOUS

## 2022-08-15 IMAGING — MR MR HEAD WO/W CM
14 series · 48 of 48 positions shown · IV contrast (gadavist)
Comparison: None.

CLINICAL DATA: Chronic pain and fatigue with memory loss and brain
fog. Dizziness and trouble finding words. Symptoms for 6 months and
progressive

EXAM:
MRI HEAD WITHOUT AND WITH CONTRAST
TECHNIQUE: Multiplanar, multiecho pulse sequences of the brain and surrounding
structures were obtained without and with intravenous contrast.
CONTRAST:  7mL GADAVIST GADOBUTROL 1 MMOL/ML IV SOLN

[Series 5: ax dwi_tracew · axial · 3.0mm · 0.65mm/px · z∈[-113,+28]mm · 2 of 44 slices shown]
[im 1/44]
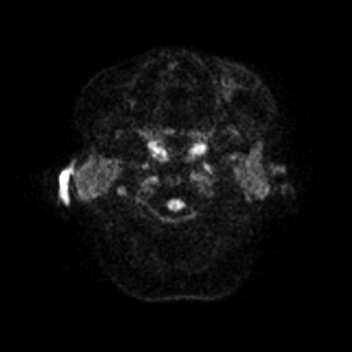
[im 44/44]
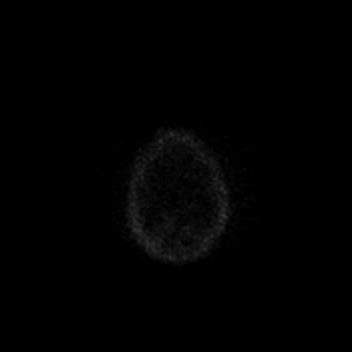

[Series 6: ax dwi_adc · axial · 3.0mm · 0.65mm/px · z∈[-113,+28]mm · 2 of 44 slices shown]
[im 1/44]
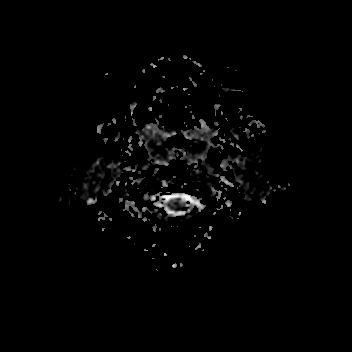
[im 44/44]
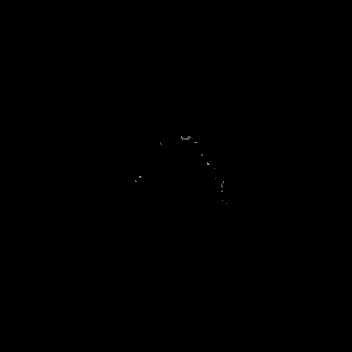

[Series 7: cor dwi_tracew · coronal · 5.0mm · 0.60mm/px · 2 of 36 slices shown]
[im 1/36]
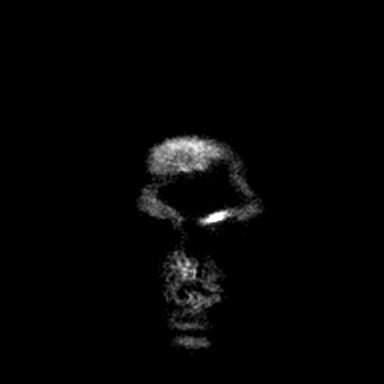
[im 36/36]
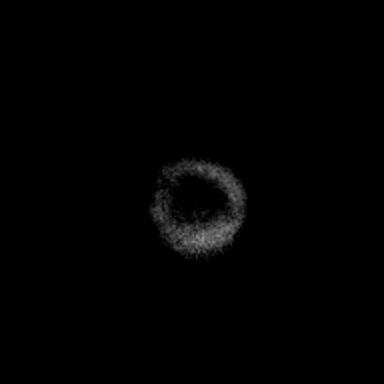

[Series 8: cor dwi_adc · coronal · 5.0mm · 0.60mm/px · 2 of 35 slices shown]
[im 1/35]
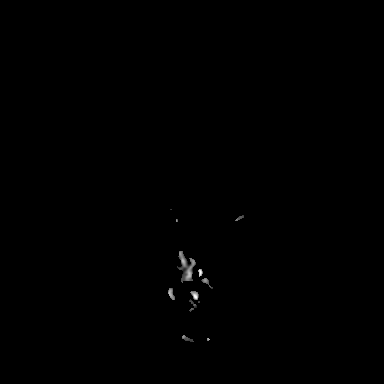
[im 35/35]
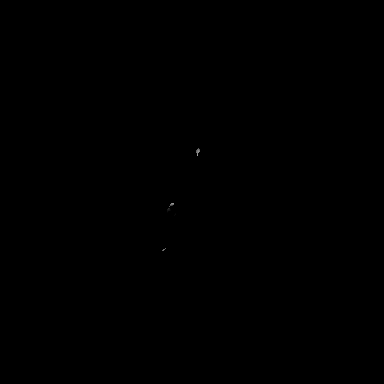

[Series 9: T1 · sagittal · 5.0mm · 0.62mm/px · 1 of 21 slices shown (1 of 2)]
[im 1/21]
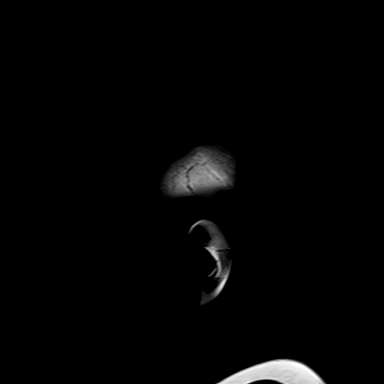

[Series 10: T2 · axial · 5.0mm · 0.53mm/px · z∈[-114,+29]mm · 2 of 25 slices shown]
[im 1/25]
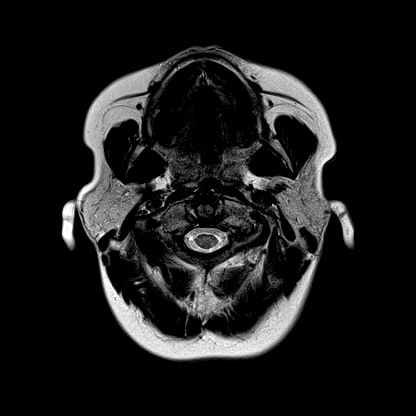
[im 25/25]
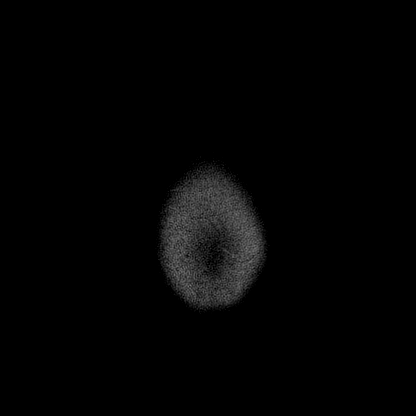

[Series 11: mag_images · axial · 3.0mm · 0.90mm/px · z∈[-131,+44]mm · 4 of 60 slices shown]
[im 1/60]
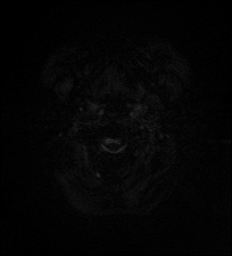
[im 20/60]
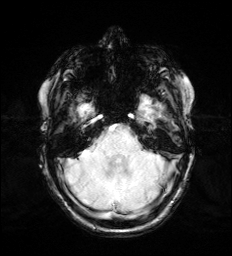
[im 40/60]
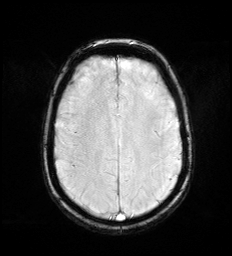
[im 60/60]
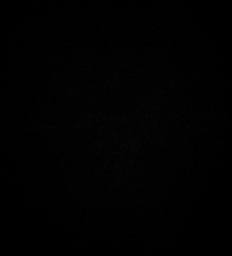

[Series 12: pha_images · axial · 3.0mm · 0.90mm/px · z∈[-131,+44]mm · 4 of 58 slices shown]
[im 1/58]
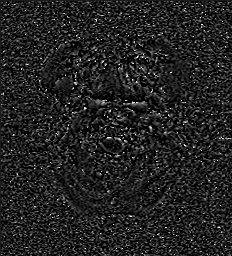
[im 20/58]
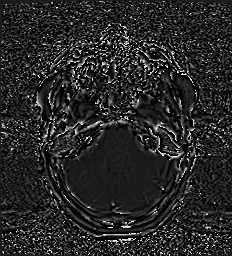
[im 39/58]
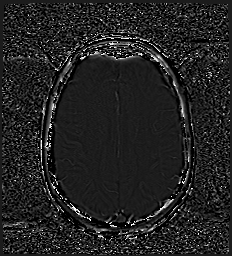
[im 58/58]
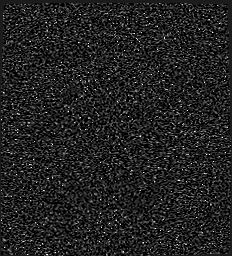

[Series 13: swi_images · axial · 3.0mm · 0.90mm/px · z∈[-131,+44]mm · 4 of 60 slices shown]
[im 1/60]
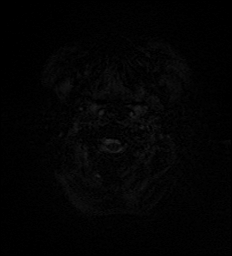
[im 20/60]
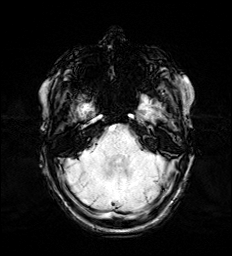
[im 40/60]
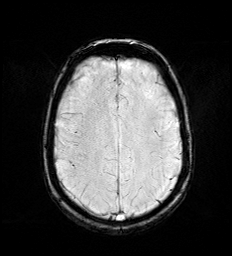
[im 60/60]
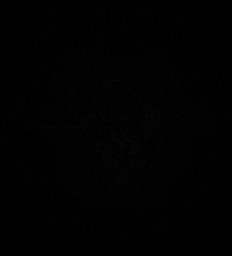

[Series 15: FLAIR · axial · 3.0mm · 0.53mm/px · z∈[-123,+38]mm · 3 of 55 slices shown]
[im 1/55]
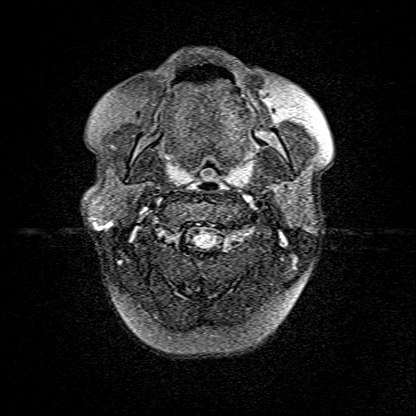
[im 28/55]
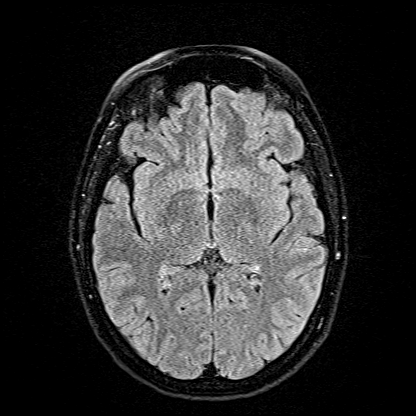
[im 55/55]
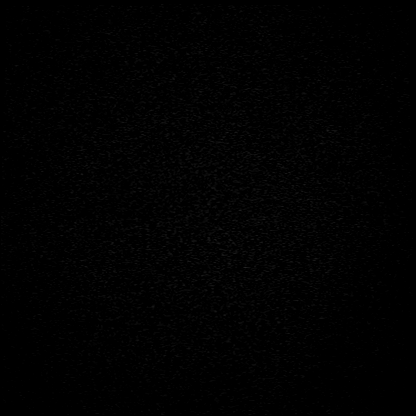

[Series 16: T1 · axial · 1.0mm · 0.98mm/px · z∈[-115,+27]mm · 9 of 144 slices shown (2 of 2)]
[im 1/144]
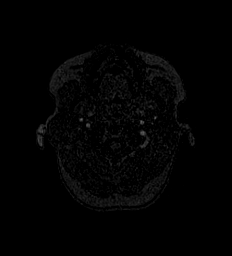
[im 18/144]
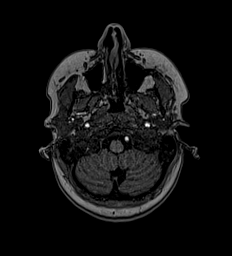
[im 36/144]
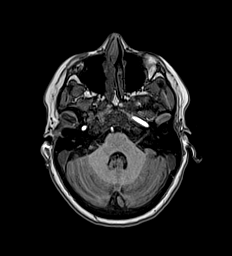
[im 54/144]
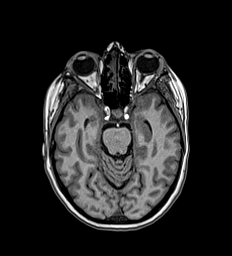
[im 72/144]
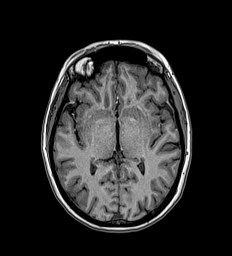
[im 90/144]
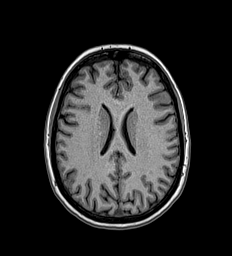
[im 108/144]
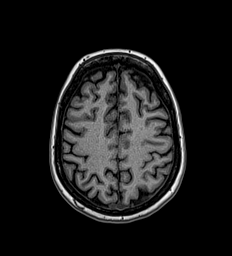
[im 126/144]
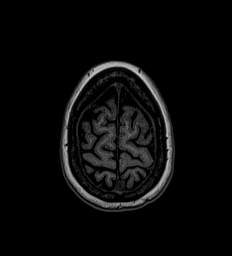
[im 144/144]
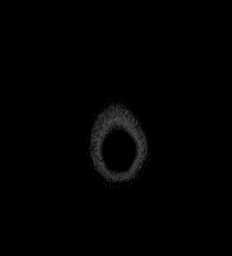

[Series 17: T2 post-contrast · coronal · 5.0mm · 0.57mm/px · 2 of 34 slices shown]
[im 1/34]
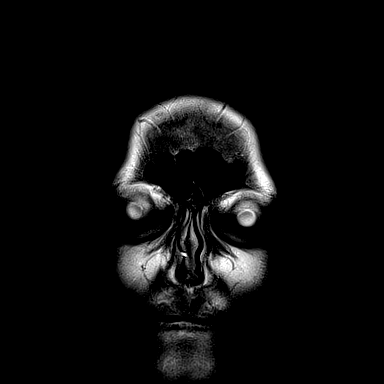
[im 34/34]
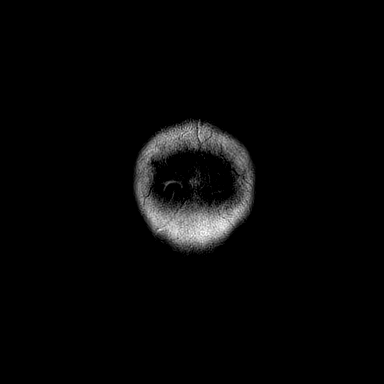

[Series 18: T1 post-contrast · axial · 1.0mm · 0.98mm/px · z∈[-115,+27]mm · 9 of 144 slices shown (1 of 2)]
[im 1/144]
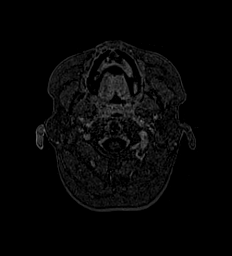
[im 18/144]
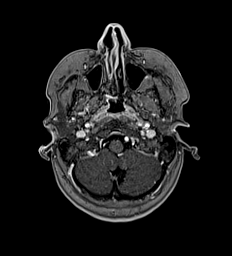
[im 36/144]
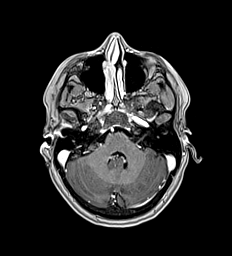
[im 54/144]
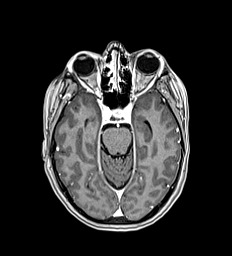
[im 72/144]
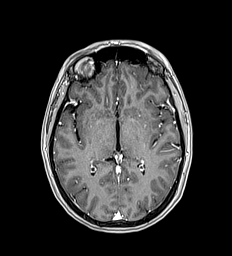
[im 90/144]
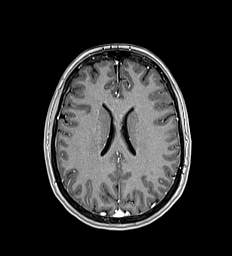
[im 108/144]
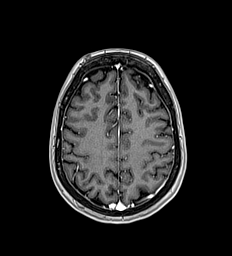
[im 126/144]
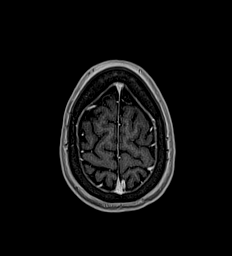
[im 144/144]
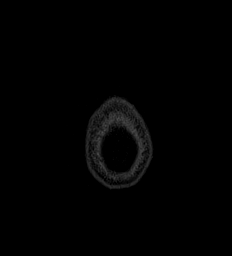

[Series 19: T1 post-contrast · coronal · 5.0mm · 0.57mm/px · 2 of 34 slices shown (2 of 2)]
[im 1/34]
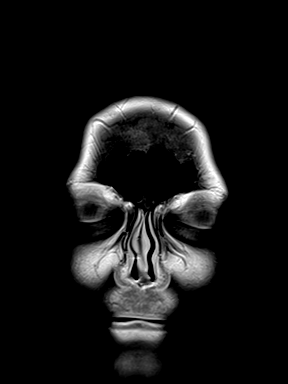
[im 34/34]
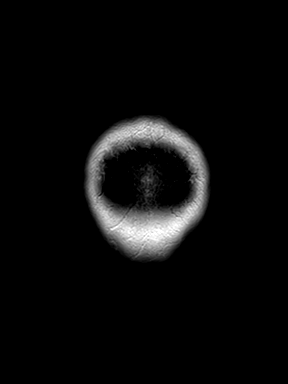

[48 of 48 positions shown; findings below may reference images not displayed]

FINDINGS: Brain: No white matter disease, atrophy, infarction, hemorrhage,
hydrocephalus, extra-axial collection or mass lesion. No abnormal
enhancement.

Vascular: Normal flow voids.

Skull and upper cervical spine: Normal marrow signal.

Sinuses/Orbits: Negative.
IMPRESSION: Normal brain MRI.
